# Patient Record
Sex: Female | Born: 2005 | Race: White | Hispanic: No | Smoking: Never smoker
Health system: Southern US, Community
[De-identification: ages and names within clinical notes are randomized; demographics above are authoritative.]

## PROBLEM LIST (undated history)

## (undated) DIAGNOSIS — G43409 Hemiplegic migraine, not intractable, without status migrainosus: Secondary | ICD-10-CM

## (undated) DIAGNOSIS — G43909 Migraine, unspecified, not intractable, without status migrainosus: Secondary | ICD-10-CM

## (undated) DIAGNOSIS — G90A Postural orthostatic tachycardia syndrome (POTS): Secondary | ICD-10-CM

## (undated) DIAGNOSIS — G43109 Migraine with aura, not intractable, without status migrainosus: Secondary | ICD-10-CM

## (undated) DIAGNOSIS — J45909 Unspecified asthma, uncomplicated: Secondary | ICD-10-CM

## (undated) HISTORY — DX: Postural orthostatic tachycardia syndrome (POTS): G90.A

## (undated) HISTORY — PX: NO PAST SURGERIES: SHX2092

## (undated) HISTORY — DX: Hemiplegic migraine, not intractable, without status migrainosus: G43.409

## (undated) HISTORY — DX: Migraine with aura, not intractable, without status migrainosus: G43.109

---
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
## 2024-01-25 ENCOUNTER — Ambulatory Visit
Admission: RE | Admit: 2024-01-25 | Discharge: 2024-01-25 | Disposition: A | Discharge status: Home / Self Care | Payer: MEDICAID | Source: Ambulatory Visit | Attending: Family Medicine | Admitting: Family Medicine

## 2024-01-25 VITALS — BP 119/80 | HR 56 | Temp 99.1°F | Ht 62.0 in | Wt 110.0 lb

## 2024-01-25 DIAGNOSIS — R202 Paresthesia of skin: Secondary | ICD-10-CM | POA: Diagnosis not present

## 2024-01-25 NOTE — ED Triage Notes (Signed)
 Pt is with her boyfriend  Pt c/o left 5th toe numbness x1week  Pt states that she recently worked a 12 hours shift and wore different shoes.  PT states her toe "feels like a rock" and gives a pins and needles sensation when she puts pressure on the side of her foot.   Pt works as a Lawyer

## 2024-01-25 NOTE — ED Provider Notes (Signed)
 [facility]    CSN: [id number] Arrival date & time: 01/25/24  1732      History   Chief Complaint Chief Complaint  Patient presents with   Foot Pain    HPI Sabrina Chung is a 18 y.o. female presents for toe paresthesia.  Patient reports intermittent bilateral fifth toe paresthesia that has been ongoing for several months.  Sometimes it is the left sometimes it is the right.  States it only occurs after working 12-hour shifts on her feet or wearing different shoes.  States it only goes away after little bit of time but this time it seems to be more persistent.  Current symptoms ongoing 1 week.  Denies any pain, discoloration or temperature change in the toe.  No injury.  Denies any foot pain, calf pain or pain with walking.  No history of diabetes.  Denies smoking history.  Does have a history of POTS and Raynaud's.   Foot Pain    Past Medical History:  Diagnosis Date   Hemiplegic migraine    [facility]   Migraine    Migraine with aura    POTS (postural orthostatic tachycardia syndrome)    Reactive airway disease     There are no active problems to display for this patient.   Past Surgical History:  Procedure Laterality Date   NO PAST SURGERIES      OB History     Gravida  0   Para  0   Term  0   Preterm  0   AB  0   Living  0      SAB  0   IAB  0   Ectopic  0   Multiple  0   Live Births  0            Home Medications    Prior to Admission medications   Medication Sig Start Date End Date Taking? Authorizing Provider  cyproheptadine (PERIACTIN) 2 MG/5ML syrup Take 2 mg by mouth at bedtime. 04/27/22  Yes [provider]  famotidine (PEPCID) 20 MG tablet Take 20 mg by mouth 2 (two) times daily. 10/29/20  Yes [provider]  hydrOXYzine (ATARAX/VISTARIL) 25 MG tablet Take by mouth. 10/08/19  Yes [provider]  ibuprofen (ADVIL) 200 MG tablet Take by mouth.   Yes [provider]  ketorolac  (TORADOL) 10 MG tablet Take by mouth. 10/08/19  Yes [provider]  ondansetron  (ZOFRAN -ODT) 8 MG disintegrating tablet Take by mouth. 10/08/19  Yes [provider]  phenazopyridine  (PYRIDIUM ) 100 MG tablet TAKE 1 TABLET (100 MG TOTAL) BY MOUTH 3 (THREE) TIMES DAILY AS NEEDED FOR UP TO 5 DAYS FOR PAIN. 05/21/22  Yes [provider]  promethazine (PHENERGAN) 12.5 MG tablet Take 12.5 mg by mouth every 6 (six) hours as needed. 04/27/22  Yes [provider]  VENTOLIN HFA 108 (90 Base) MCG/ACT inhaler INHALE 1 PUFF BY MOUTH 4 TIMES DAILY AS NEEDED 08/20/22  Yes [provider]  etonogestrel  (NEXPLANON ) 68 MG IMPL implant 1 each by Subdermal route once. 10/13/22 10/13/25  [provider]  amitriptyline (ELAVIL) 25 MG tablet FOR THE FIRST WEEK TAKE 1/2 TABLET NIGHTLY AND THEN INCREASE TO 1 FULL TABLET NIGHTLY AND CONTINUE. 12/31/19 11/09/20  [provider]  traZODone (DESYREL) 50 MG tablet Take 50 mg by mouth at bedtime as needed. 07/05/19 03/17/20  [provider]    Family History Family History  Problem Relation Age of Onset  Diabetes Father    Breast cancer Paternal Grandmother 28    Social History Social History   Tobacco Use   Smoking status: Never    Passive exposure: Yes   Smokeless tobacco: Never  Vaping Use   Vaping status: Never Used  Substance Use Topics   Alcohol use: Never   Drug use: Never     Allergies   Patient has no known allergies.   Review of Systems Review of Systems  Skin:        Left fifth toe paresthesias     Physical Exam Triage Vital Signs ED Triage Vitals [01/25/24 1749]  Encounter Vitals Group     BP 119/80     Systolic BP Percentile      Diastolic BP Percentile      Pulse Rate (!) 56     Resp      Temp 99.1 F (37.3 C)     Temp Source Oral     SpO2 97 %     Weight 110 lb (49.9 kg)     Height 5\' 2"  (1.575 m)     Head Circumference      Peak Flow      Pain Score 2     Pain  Loc      Pain Education      Exclude from Growth Chart    No data found.  Updated Vital Signs BP 119/80 (BP Location: Left Arm)   Pulse (!) 56   Temp 99.1 F (37.3 C) (Oral)   Ht 5\' 2"  (1.575 m)   Wt 110 lb (49.9 kg)   SpO2 97%   BMI 20.12 kg/m   Visual Acuity Right Eye Distance:   Left Eye Distance:   Bilateral Distance:    Right Eye Near:   Left Eye Near:    Bilateral Near:     Physical Exam Vitals and nursing note reviewed.  Constitutional:      General: She is not in acute distress.    Appearance: Normal appearance. She is not ill-appearing.  HENT:     Head: Normocephalic and atraumatic.  Eyes:     Pupils: Pupils are equal, round, and reactive to light.  Cardiovascular:     Rate and Rhythm: Normal rate.     Pulses:          Dorsalis pedis pulses are 2+ on the left side.  Pulmonary:     Effort: Pulmonary effort is normal.  Feet:     Left foot:     Skin integrity: Skin integrity normal.     Toenail Condition: Left toenails are normal.     Comments: Skin is intact to the left fifth toe.  Cap refill +2.  There is no tenderness with palpation.  No deformity.  Full range of motion of toe without pain or restriction. Skin:    General: Skin is warm and dry.  Neurological:     General: No focal deficit present.     Mental Status: She is alert and oriented to person, place, and time.  Psychiatric:        Mood and Affect: Mood normal.        Behavior: Behavior normal.      UC Treatments / Results  Labs (all labs ordered are listed, but only abnormal results are displayed) Labs Reviewed - No data to display  EKG   Radiology No results found.  Procedures Procedures (including critical care time)  Medications Ordered in UC Medications - No data  to display  Initial Impression / Assessment and Plan / UC Course  I have reviewed the triage vital signs and the nursing notes.  Pertinent labs & imaging results that were available during my care of the  patient were reviewed by me and considered in my medical decision making (see chart for details).     Reviewed exam and symptoms with patient.  Discussed limitations and abilities of urgent care.  Patient with intermittent bilateral fifth toe paresthesias.  Unclear cause.  Circulation is intact and there is no sign of infection.  Will refer to podiatry for further workup given symptoms.  Advised to wear loosefitting shoes until seen.  Was advised to go to the ER for any worsening symptoms that occur prior to seeing podiatry, red flags reviewed and she verbalized understanding. Final Clinical Impressions(s) / UC Diagnoses   Final diagnoses:  Paresthesia of foot     Discharge Instructions      Wear loosefitting shoes and please follow-up with podiatry for further evaluation of your symptoms.  Please go to the ER if you develop any worsening symptoms such as change in coloring or temperature of your toe, pain, or any new concerns that arise.    ED Prescriptions   None    PDMP not reviewed this encounter.   Alleen Arbour, NP 01/25/24 [phone]

## 2024-01-25 NOTE — Discharge Instructions (Addendum)
 Wear loosefitting shoes and please follow-up with podiatry for further evaluation of your symptoms.  Please go to the ER if you develop any worsening symptoms such as change in coloring or temperature of your toe, pain, or any new concerns that arise.

## 2024-07-08 ENCOUNTER — Ambulatory Visit: Payer: MEDICAID

## 2024-10-18 ENCOUNTER — Other Ambulatory Visit: Payer: Self-pay

## 2024-10-18 ENCOUNTER — Emergency Department
Admission: EM | Admit: 2024-10-18 | Discharge: 2024-10-18 | Disposition: A | Discharge status: Home / Self Care | Payer: MEDICAID | Attending: Emergency Medicine | Admitting: Emergency Medicine

## 2024-10-18 DIAGNOSIS — G8918 Other acute postprocedural pain: Secondary | ICD-10-CM | POA: Insufficient documentation

## 2024-10-18 DIAGNOSIS — J029 Acute pharyngitis, unspecified: Secondary | ICD-10-CM | POA: Diagnosis not present

## 2024-10-18 LAB — COMPREHENSIVE METABOLIC PANEL WITH GFR
ALT: 14 U/L (ref 0–44)
AST: 13 U/L — ABNORMAL LOW (ref 15–41)
Albumin: 4.3 g/dL (ref 3.5–5.0)
Alkaline Phosphatase: 57 U/L (ref 38–126)
Anion gap: 8 (ref 5–15)
BUN: 7 mg/dL (ref 6–20)
CO2: 26 mmol/L (ref 22–32)
Calcium: 9.5 mg/dL (ref 8.9–10.3)
Chloride: 103 mmol/L (ref 98–111)
Creatinine, Ser: 0.73 mg/dL (ref 0.44–1.00)
GFR, Estimated: >60 mL/min
Glucose, Bld: 88 mg/dL (ref 70–99)
Potassium: 4.3 mmol/L (ref 3.5–5.1)
Sodium: 138 mmol/L (ref 135–145)
Total Bilirubin: 0.5 mg/dL (ref 0.0–1.2)
Total Protein: 6.3 g/dL — ABNORMAL LOW (ref 6.5–8.1)

## 2024-10-18 LAB — CBC
HCT: 37.5 % (ref 36.0–46.0)
Hemoglobin: 12.1 g/dL (ref 12.0–15.0)
MCH: 30.8 pg (ref 26.0–34.0)
MCHC: 32.3 g/dL (ref 30.0–36.0)
MCV: 95.4 fL (ref 80.0–100.0)
Platelets: 292 K/uL (ref 150–400)
RBC: 3.93 MIL/uL (ref 3.87–5.11)
RDW: 12.4 % (ref 11.5–15.5)
WBC: 11.4 K/uL — ABNORMAL HIGH (ref 4.0–10.5)
nRBC: 0 % (ref 0.0–0.2)

## 2024-10-18 LAB — LIPASE, BLOOD: Lipase: 22 U/L (ref 11–51)

## 2024-10-18 MED ORDER — KETOROLAC TROMETHAMINE 15 MG/ML IJ SOLN
15.0000 mg | Freq: Once | INTRAMUSCULAR | Status: AC
  Administered 2024-10-18: 15 mg via INTRAVENOUS
  Filled 2024-10-18: qty 1

## 2024-10-18 NOTE — Discharge Instructions (Signed)
 Please take Tylenol/ibuprofen per package instructions to help with your symptoms.  Please follow-up with your ENT.  Please return for any new, worsening, or changing symptoms or other concerns.

## 2024-10-18 NOTE — ED Triage Notes (Signed)
 Pt to ED via POV from home. Pt ambulatory to triage. Pt reports had tonsils removed on 1/13. Pt reports constipation since surgery, nausea and abd pain. Pt reports pain is unbearable. Last BM 1/13. Pt has stopped taking oxycodone.

## 2024-10-18 NOTE — ED Provider Notes (Signed)
 "  [facility] Provider Note    Event Date/Time   First MD Initiated Contact with Patient 10/18/24 1129     (approximate)   History   Post-op Problem   HPI  Sabrina Chung is a 19 y.o. female who presents today for evaluation of sore throat.  Patient reports that she had her tonsils removed on 10/16/2023 and the oxycodone is making her nauseous and constipated.  She is wondering what else to do for pain.  No abdominal pain.  No fevers or chills.  Still passing gas.  There are no active problems to display for this patient.         Physical Exam   Triage Vital Signs: ED Triage Vitals  Encounter Vitals Group     BP 10/18/24 0955 109/68     Girls Systolic BP Percentile --      Girls Diastolic BP Percentile --      Boys Systolic BP Percentile --      Boys Diastolic BP Percentile --      Pulse Rate 10/18/24 0955 68     Resp 10/18/24 0955 20     Temp 10/18/24 0955 98 F (36.7 C)     Temp Source 10/18/24 1008 Oral     SpO2 10/18/24 0955 98 %     Weight --      Height --      Head Circumference --      Peak Flow --      Pain Score 10/18/24 0955 6     Pain Loc --      Pain Education --      Exclude from Growth Chart --     Most recent vital signs: Vitals:   10/18/24 1008 10/18/24 1214  BP: (!) 126/91   Pulse: 82 88  Resp: 19 18  Temp: (!) 97.4 F (36.3 C) 97.8 F (36.6 C)  SpO2: 97% 97%    Physical Exam Vitals and nursing note reviewed.  Constitutional:      General: Awake and alert. No acute distress.    Appearance: Normal appearance. The patient is normal weight.  HENT:     Head: Normocephalic and atraumatic.     Mouth: Mucous membranes are moist.  Postsurgical changes noted to bilateral tonsillar area.  No bleeding.  No edema.  No trismus or drooling. Eyes:     General: PERRL. Normal EOMs        Right eye: No discharge.        Left eye: No discharge.     Conjunctiva/sclera: Conjunctivae normal.  Cardiovascular:     Rate and  Rhythm: Normal rate and regular rhythm.     Pulses: Normal pulses.  Pulmonary:     Effort: Pulmonary effort is normal. No respiratory distress.     Breath sounds: Normal breath sounds.  Abdominal:     Abdomen is soft. There is no abdominal tenderness. No rebound or guarding. No distention. Musculoskeletal:        General: No swelling. Normal range of motion.     Cervical back: Normal range of motion and neck supple.  Skin:    General: Skin is warm and dry.     Capillary Refill: Capillary refill takes less than 2 seconds.     Findings: No rash.  Neurological:     Mental Status: The patient is awake and alert.      ED Results / Procedures / Treatments   Labs (all labs ordered are listed, but  only abnormal results are displayed) Labs Reviewed  CBC - Abnormal; Notable for the following components:      Result Value   WBC 11.4 (*)    All other components within normal limits  COMPREHENSIVE METABOLIC PANEL WITH GFR - Abnormal; Notable for the following components:   Total Protein 6.3 (*)    AST 13 (*)    All other components within normal limits  LIPASE, BLOOD  URINALYSIS, ROUTINE W REFLEX MICROSCOPIC  POC URINE PREG, ED     EKG     RADIOLOGY     PROCEDURES:  Critical Care performed:   Procedures   MEDICATIONS ORDERED IN ED: Medications  ketorolac  (TORADOL ) 15 MG/ML injection 15 mg (15 mg Intravenous Given 10/18/24 1159)     IMPRESSION / MDM / ASSESSMENT AND PLAN / ED COURSE  I reviewed the triage vital signs and the nursing notes.   Differential diagnosis includes, but is not limited to, postsurgical pain, constipation, scar tissue.  Patient presents today awake and alert, hemodynamically stable and afebrile.  She is markedly well-appearing for 3 days postoperative.  She has normal-appearing scarring and postsurgical changes to posterior oropharynx and the tonsillar fossa, no bleeding.  No voice change.  No drooling, no trismus.  Labs obtained are  overall reassuring.  She was treated with IV Toradol  with good effect.  Recommended that she stop the oxycodone given her side effects of constipation and nausea.  Recommended Tylenol/ibuprofen which patient is comfortable with.  Recommended close outpatient follow-up with her ENT and return precautions.  Patient understands and agrees with plan.  Discharged in stable condition   Patient's presentation is most consistent with acute complicated illness / injury requiring diagnostic workup.      FINAL CLINICAL IMPRESSION(S) / ED DIAGNOSES   Final diagnoses:  Post-operative pain     Rx / DC Orders   ED Discharge Orders     None        Note:  This document was prepared using Dragon voice recognition software and may include unintentional dictation errors.   Daman Steffenhagen E, PA-C 10/18/24 1232    Viviann Pastor, MD 10/18/24 1406  "
